# Patient Record
Sex: Male | Born: 1990 | Race: Black or African American | Hispanic: No | Marital: Single | Smoking: Never smoker
Health system: Southern US, Community
[De-identification: ages and names within clinical notes are randomized; demographics above are authoritative.]

## PROBLEM LIST (undated history)

## (undated) DIAGNOSIS — T7840XA Allergy, unspecified, initial encounter: Secondary | ICD-10-CM

## (undated) HISTORY — DX: Allergy, unspecified, initial encounter: T78.40XA

---

## 2009-03-13 ENCOUNTER — Encounter: Admission: RE | Admit: 2009-03-13 | Discharge: 2009-03-13 | Payer: Self-pay | Admitting: Internal Medicine

## 2013-07-28 ENCOUNTER — Ambulatory Visit (INDEPENDENT_AMBULATORY_CARE_PROVIDER_SITE_OTHER): Payer: BC Managed Care – PPO | Admitting: Emergency Medicine

## 2013-07-28 ENCOUNTER — Ambulatory Visit: Payer: BC Managed Care – PPO

## 2013-07-28 VITALS — BP 118/72 | HR 75 | Temp 98.1°F | Resp 16 | Ht 69.0 in | Wt 158.0 lb

## 2013-07-28 DIAGNOSIS — S335XXA Sprain of ligaments of lumbar spine, initial encounter: Secondary | ICD-10-CM

## 2013-07-28 DIAGNOSIS — S139XXA Sprain of joints and ligaments of unspecified parts of neck, initial encounter: Secondary | ICD-10-CM

## 2013-07-28 MED ORDER — CYCLOBENZAPRINE HCL 10 MG PO TABS: 10.0000 mg | ORAL_TABLET | Freq: Three times a day (TID) | ORAL | Status: DC | PRN

## 2013-07-28 MED ORDER — NAPROXEN SODIUM 550 MG PO TABS: 550.0000 mg | ORAL_TABLET | Freq: Two times a day (BID) | ORAL | Status: DC
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
## 2013-07-28 NOTE — Patient Instructions (Signed)
lumbosac Lumbosacral Strain Lumbosacral strain is a strain of any of the parts that make up your lumbosacral vertebrae. Your lumbosacral vertebrae are the bones that make up the lower third of your backbone. Your lumbosacral vertebrae are held together by muscles and tough, fibrous tissue (ligaments).  CAUSES  A sudden blow to your back can cause lumbosacral strain. Also, anything that causes an excessive stretch of the muscles in the low back can cause this strain. This is typically seen when people exert themselves strenuously, fall, lift heavy objects, bend, or crouch repeatedly. RISK FACTORS  Physically demanding work.  Participation in pushing or pulling sports or sports that require sudden twist of the back (tennis, golf, baseball).  Weight lifting.  Excessive lower back curvature.  Forward-tilted pelvis.  Weak back or abdominal muscles or both.  Tight hamstrings. SIGNS AND SYMPTOMS  Lumbosacral strain may cause pain in the area of your injury or pain that moves (radiates) down your leg.  DIAGNOSIS Your health care provider can often diagnose lumbosacral strain through a physical exam. In some cases, you may need tests such as X-ray exams.  TREATMENT  Treatment for your lower back injury depends on many factors that your clinician will have to evaluate. However, most treatment will include the use of anti-inflammatory medicines. HOME CARE INSTRUCTIONS   Avoid hard physical activities (tennis, racquetball, waterskiing) if you are not in proper physical condition for it. This may aggravate or create problems.  If you have a back problem, avoid sports requiring sudden body movements. Swimming and walking are generally safer activities.  Maintain good posture.  Maintain a healthy weight.  For acute conditions, you may put ice on the injured area.  Put ice in a plastic bag.  Place a towel between your skin and the bag.  Leave the ice on for 20 minutes, 2 3 times a  day.  When the low back starts healing, stretching and strengthening exercises may be recommended. SEEK MEDICAL CARE IF:  Your back pain is getting worse.  You experience severe back pain not relieved with medicines. SEEK IMMEDIATE MEDICAL CARE IF:   You have numbness, tingling, weakness, or problems with the use of your arms or legs.  There is a change in bowel or bladder control.  You have increasing pain in any area of the body, including your belly (abdomen).  You notice shortness of breath, dizziness, or feel faint.  You feel sick to your stomach (nauseous), are throwing up (vomiting), or become sweaty.  You notice discoloration of your toes or legs, or your feet get very cold. MAKE SURE YOU:   Understand these instructions.  Will watch your condition.  Will get help right away if you are not doing well or get worse. Document Released: 03/27/2005 Document Revised: 04/07/2013 Document Reviewed: 02/03/2013 [facility] Patient Information 2014 [employer] [place]. Cervical Sprain A cervical sprain is an injury in the neck in which the strong, fibrous tissues (ligaments) that connect your neck bones stretch or tear. Cervical sprains can range from mild to severe. Severe cervical sprains can cause the neck vertebrae to be unstable. This can lead to damage of the spinal cord and can result in serious nervous system problems. The amount of time it takes for a cervical sprain to get better depends on the cause and extent of the injury. Most cervical sprains heal in 1 to 3 weeks. CAUSES  Severe cervical sprains may be caused by:   Contact sport injuries (such as from football, rugby, wrestling, hockey,  auto racing, gymnastics, diving, martial arts, or boxing).   Motor vehicle collisions.   Whiplash injuries. This is an injury from a sudden forward-and backward whipping movement of the head and neck.  Falls.  Mild cervical sprains may be caused by:   Being in an awkward position,  such as while cradling a telephone between your ear and shoulder.   Sitting in a chair that does not offer proper support.   Working at a poorly Marketing executive station.   Looking up or down for long periods of time.  SYMPTOMS   Pain, soreness, stiffness, or a burning sensation in the front, back, or sides of the neck. This discomfort may develop immediately after the injury or slowly, 24 hours or more after the injury.   Pain or tenderness directly in the middle of the back of the neck.   Shoulder or upper back pain.   Limited ability to move the neck.   Headache.   Dizziness.   Weakness, numbness, or tingling in the hands or arms.   Muscle spasms.   Difficulty swallowing or chewing.   Tenderness and swelling of the neck.  DIAGNOSIS  Most of the time your health care provider can diagnose a cervical sprain by taking your history and doing a physical exam. Your health care provider will ask about previous neck injuries and any known neck problems, such as arthritis in the neck. X-rays may be taken to find out if there are any other problems, such as with the bones of the neck. Other tests, such as a CT scan or MRI, may also be needed.  TREATMENT  Treatment depends on the severity of the cervical sprain. Mild sprains can be treated with rest, keeping the neck in place (immobilization), and pain medicines. Severe cervical sprains are immediately immobilized. Further treatment is done to help with pain, muscle spasms, and other symptoms and may include:  Medicines, such as pain relievers, numbing medicines, or muscle relaxants.   Physical therapy. This may involve stretching exercises, strengthening exercises, and posture training. Exercises and improved posture can help stabilize the neck, strengthen muscles, and help stop symptoms from returning.  HOME CARE INSTRUCTIONS   Put ice on the injured area.   Put ice in a plastic bag.   Place a towel between your  skin and the bag.   Leave the ice on for 15 20 minutes, 3 4 times a day.   If your injury was severe, you may have been given a cervical collar to wear. A cervical collar is a two-piece collar designed to keep your neck from moving while it heals.  Do not remove the collar unless instructed by your health care provider.  If you have long hair, keep it outside of the collar.  Ask your health care provider before making any adjustments to your collar. Minor adjustments may be required over time to improve comfort and reduce pressure on your chin or on the back of your head.  Ifyou are allowed to remove the collar for cleaning or bathing, follow your health care provider's instructions on how to do so safely.  Keep your collar clean by wiping it with mild soap and water and drying it completely. If the collar you have been given includes removable pads, remove them every 1 2 days and hand wash them with soap and water. Allow them to air dry. They should be completely dry before you wear them in the collar.  If you are allowed to remove the  collar for cleaning and bathing, wash and dry the skin of your neck. Check your skin for irritation or sores. If you see any, tell your health care provider.  Do not drive while wearing the collar.   Only take over-the-counter or prescription medicines for pain, discomfort, or fever as directed by your health care provider.   Keep all follow-up appointments as directed by your health care provider.   Keep all physical therapy appointments as directed by your health care provider.   Make any needed adjustments to your workstation to promote good posture.   Avoid positions and activities that make your symptoms worse.   Warm up and stretch before being active to help prevent problems.  SEEK MEDICAL CARE IF:   Your pain is not controlled with medicine.   You are unable to decrease your pain medicine over time as planned.   Your activity  level is not improving as expected.  SEEK IMMEDIATE MEDICAL CARE IF:   You develop any bleeding.  You develop stomach upset.  You have signs of an allergic reaction to your medicine.   Your symptoms get worse.   You develop new, unexplained symptoms.   You have numbness, tingling, weakness, or paralysis in any part of your body.  MAKE SURE YOU:   Understand these instructions.  Will watch your condition.  Will get help right away if you are not doing well or get worse. Document Released: 04/14/2007 Document Revised: 04/07/2013 Document Reviewed: 12/23/2012 [facility] Patient Information 2014 [place], [place].

## 2013-10-02 ENCOUNTER — Ambulatory Visit (INDEPENDENT_AMBULATORY_CARE_PROVIDER_SITE_OTHER): Payer: BC Managed Care – PPO | Admitting: Physician Assistant

## 2013-10-02 VITALS — BP 102/64 | HR 72 | Temp 97.5°F | Resp 16 | Ht 69.0 in | Wt 162.6 lb

## 2013-10-02 DIAGNOSIS — J029 Acute pharyngitis, unspecified: Secondary | ICD-10-CM

## 2013-10-02 DIAGNOSIS — B9789 Other viral agents as the cause of diseases classified elsewhere: Secondary | ICD-10-CM

## 2013-10-02 DIAGNOSIS — J069 Acute upper respiratory infection, unspecified: Secondary | ICD-10-CM

## 2013-10-02 LAB — POCT RAPID STREP A (OFFICE): RAPID STREP A SCREEN: NEGATIVE

## 2013-10-02 MED ORDER — HYDROCOD POLST-CHLORPHEN POLST 10-8 MG/5ML PO LQCR: 5.0000 mL | Freq: Two times a day (BID) | ORAL | Status: AC

## 2013-10-02 MED ORDER — TRIAMCINOLONE ACETONIDE 55 MCG/ACT NA AERO: 2.0000 | INHALATION_SPRAY | Freq: Every day | NASAL | Status: DC

## 2013-10-02 MED ORDER — GUAIFENESIN ER 1200 MG PO TB12: 1.0000 | ORAL_TABLET | Freq: Two times a day (BID) | ORAL | Status: AC

## 2013-10-02 NOTE — Patient Instructions (Signed)
Please push fluids.  Tylenol and Motrin for fever and body aches and feeling poorly.

## 2013-10-02 NOTE — Progress Notes (Signed)
   Subjective:    Patient ID: Andrew Patterson, male    DOB: 1990-07-27, 23 y.o.   MRN: [id number]  HPI Pt presents to clinic with about 1.5 week h/o congestion with minimal rhinorrhea sore throat that is worse in the am and then improved but does not resolve during the day.  Then today he developed chills and subjective fever with a dry cough.  Feels like his allergies get worse each year though he uses nothing for them.  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 Negative for fever.  HENT: Positive for congestion, postnasal drip, rhinorrhea and sore throat.   Respiratory: Positive for cough (PND dry cough).   Musculoskeletal: Negative for myalgias.  Neurological: Positive for headaches (intermittent - not sure they are related).       Objective:   Physical Exam  Vitals reviewed. Constitutional: He appears well-developed and well-nourished.  HENT:  Head: Normocephalic and atraumatic.  Right Ear: External ear normal.  Left Ear: External ear normal.  Cardiovascular: Normal rate, regular rhythm and normal heart sounds.   No murmur heard. Pulmonary/Chest: Effort normal and breath sounds normal. He has no wheezes.   Results for orders placed in visit on 10/02/13  POCT RAPID STREP A (OFFICE)      Result Value Ref Range   Rapid Strep A Screen Negative  Negative        Assessment & Plan:  Sore throat - Plan: POCT rapid strep A  Viral URI with cough - Plan: Guaifenesin (MUCINEX MAXIMUM STRENGTH) 1200 MG TB12, chlorpheniramine-HYDROcodone (TUSSIONEX PENNKINETIC ER) 10-8 MG/5ML LQCR, triamcinolone (NASACORT AQ) 55 MCG/ACT AERO nasal inhaler  He will do symptomatic treatment for the next several days - and then if he is no better he will let me know in about 1 week.  I think most of his problem is allergies and he may have a virus now.  Benny LennertSarah Yuchen Fedor PA-C  [facility] 10/02/2013 5:08 PM

## 2013-12-15 ENCOUNTER — Ambulatory Visit (INDEPENDENT_AMBULATORY_CARE_PROVIDER_SITE_OTHER): Payer: BC Managed Care – PPO | Admitting: Physician Assistant

## 2013-12-15 VITALS — BP 102/64 | HR 70 | Temp 97.8°F | Resp 18 | Ht 69.0 in | Wt 166.4 lb

## 2013-12-15 DIAGNOSIS — T7840XA Allergy, unspecified, initial encounter: Secondary | ICD-10-CM

## 2013-12-15 DIAGNOSIS — J029 Acute pharyngitis, unspecified: Secondary | ICD-10-CM

## 2013-12-15 DIAGNOSIS — J309 Allergic rhinitis, unspecified: Secondary | ICD-10-CM

## 2013-12-15 LAB — POCT RAPID STREP A (OFFICE): Rapid Strep A Screen: NEGATIVE

## 2013-12-15 MED ORDER — EPINEPHRINE 0.3 MG/0.3ML IJ SOAJ: 0.3000 mg | Freq: Once | INTRAMUSCULAR | Status: AC

## 2013-12-15 MED ORDER — MAGIC MOUTHWASH W/LIDOCAINE: 10.0000 mL | ORAL | Status: AC | PRN

## 2013-12-15 MED ORDER — FLUTICASONE PROPIONATE 50 MCG/ACT NA SUSP: 2.0000 | Freq: Every day | NASAL | Status: AC

## 2013-12-15 NOTE — Patient Instructions (Signed)
Get plenty of rest and drink at least 64 ounces of water daily. Use ibuprofen &/or acetaminophen as needed for pain, in addition to the mouthwash. I will contact you with your lab results as soon as they are available.   If you have not heard from me in 2 weeks, please contact me.  The fastest way to get your results is to register for My Chart (see the instructions on the last page of this printout).

## 2013-12-15 NOTE — Progress Notes (Signed)
Subjective:    Patient ID: Andrew Patterson, male    DOB: May 26, 1991, 23 y.o.   MRN: [id number]   PCP: No PCP Per Patient  Chief Complaint  Patient presents with  . Sore Throat    x 1 day    Medications, allergies, past medical history, surgical history, family history, social history and problem list reviewed and updated.  HPI  ST intermittently x a couple of weeks. 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 Last night ST was worse.  Much worse upon waking today. No fever, chills, nausea, vomiting or diarrhea. Had an allergic reaction last week, "I went into shock." After a meal out, developed SOB, broke out in whelts, had chills, was itchy everywhere, swelling of the lips and eyes.  Used his mother's inhaler and took a dose of Allegra.  Symptoms mostly resolved after 2 hours, though facial swelling took about 12 hours to completely resolve.  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  Review of Systems As above.    Objective:   Physical Exam  Vitals reviewed. Constitutional: He is oriented to person, place, and time. Vital signs are normal. He appears well-developed and well-nourished. He is active and cooperative. No distress.  HENT:  Head: Normocephalic and atraumatic.  Right Ear: Hearing, tympanic membrane, external ear and ear canal normal.  Left Ear: Hearing, tympanic membrane, external ear and ear canal normal.  Nose: Mucosal edema and rhinorrhea present.  No foreign bodies. Right sinus exhibits no maxillary sinus tenderness and no frontal sinus tenderness. Left sinus exhibits no maxillary sinus tenderness and no frontal sinus tenderness.  Mouth/Throat: Uvula is midline and mucous membranes are normal. No uvula swelling. Posterior oropharyngeal erythema present. No oropharyngeal exudate, posterior oropharyngeal  edema or tonsillar abscesses.  Eyes: Conjunctivae and EOM are normal. Pupils are equal, round, and reactive to light. Right eye exhibits no discharge. Left eye exhibits no discharge. No scleral icterus.  Neck: Trachea normal, normal range of motion and full passive range of motion without pain. Neck supple. No mass and no thyromegaly present.  Cardiovascular: Normal rate, regular rhythm and normal heart sounds.   Pulmonary/Chest: Effort normal and breath sounds normal.  Lymphadenopathy:       Head (right side): No submandibular, no tonsillar, no preauricular, no posterior auricular and no occipital adenopathy present.       Head (left side): No submandibular, no tonsillar, no preauricular and no occipital adenopathy present.    He has no cervical adenopathy.       Right: No supraclavicular adenopathy present.       Left: No supraclavicular adenopathy present.  Neurological: He is alert and oriented to person, place, and time. He has normal strength. No cranial nerve deficit or sensory deficit.  Skin: Skin is warm, dry and intact. No rash noted.  Psychiatric: He has a normal mood and affect. His speech is normal and behavior is normal.      Results for orders placed in visit on 12/15/13  POCT RAPID STREP A (OFFICE)      Result Value Ref Range   Rapid Strep A Screen Negative  Negative       Assessment & Plan:  1. Acute pharyngitis Supportive care.  Anticipatory guidance.  RTC if symptoms worsen/persist. - POCT rapid strep A - Culture, Group A Strep - Alum & Mag  Hydroxide-Simeth (MAGIC MOUTHWASH W/LIDOCAINE) SOLN; Take 10 mLs by mouth every 2 (two) hours as needed for mouth pain.  Dispense: 360 mL; Refill: 0  2. Allergic rhinitis - fluticasone (FLONASE) 50 MCG/ACT nasal spray; Place 2 sprays into both nostrils daily.  Dispense: 16 g; Refill: 12  3. Allergic reaction He should still pursue allergy evaluation, but epi-pen provided for use in the interim if needed. - EPINEPHrine 0.3  mg/0.3 mL IJ SOAJ injection; Inject 0.3 mLs (0.3 mg total) into the muscle once.  Dispense: 1 Device; Refill: 1  Return in about 2 days (around 12/17/2013), or if symptoms worsen or fail to improve, for re-evaluation.  Fernande Brashelle S. Jeffery, PA-C Physician Assistant-Certified [facility]

## 2013-12-17 LAB — CULTURE, GROUP A STREP: Organism ID, Bacteria: NORMAL
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
## 2017-03-04 ENCOUNTER — Ambulatory Visit (INDEPENDENT_AMBULATORY_CARE_PROVIDER_SITE_OTHER): Payer: BC Managed Care – PPO | Admitting: Emergency Medicine

## 2017-03-04 ENCOUNTER — Encounter: Payer: Self-pay | Admitting: Emergency Medicine

## 2017-03-04 VITALS — BP 116/79 | HR 99 | Temp 98.4°F | Resp 20 | Ht 70.35 in | Wt 198.4 lb

## 2017-03-04 DIAGNOSIS — M791 Myalgia, unspecified site: Secondary | ICD-10-CM | POA: Insufficient documentation

## 2017-03-04 DIAGNOSIS — B349 Viral infection, unspecified: Secondary | ICD-10-CM | POA: Diagnosis not present

## 2017-03-04 DIAGNOSIS — R0981 Nasal congestion: Secondary | ICD-10-CM | POA: Diagnosis not present

## 2017-03-04 DIAGNOSIS — J069 Acute upper respiratory infection, unspecified: Secondary | ICD-10-CM

## 2017-03-04 MED ORDER — PSEUDOEPHEDRINE-GUAIFENESIN ER 60-600 MG PO TB12: 1.0000 | ORAL_TABLET | Freq: Two times a day (BID) | ORAL | 0 refills | Status: AC

## 2017-03-04 MED ORDER — PREDNISONE 20 MG PO TABS: 40.0000 mg | ORAL_TABLET | Freq: Every day | ORAL | 0 refills | Status: AC

## 2017-03-04 NOTE — Progress Notes (Signed)
Andrew Patterson 26 y.o.   Chief Complaint  Patient presents with  . Nasal Congestion    X 3 days- sinus problems  . Cough    X 3 days  . Generalized Body Aches    X 3 days - pt states with fever    HISTORY OF PRESENT ILLNESS: This is a 26 y.o. male complaining of congestion, cough, generalized body aches x 3 days.  Influenza  This is a new problem. The current episode started in the past 7 days. The problem occurs constantly. The problem has been gradually worsening. Associated symptoms include congestion, coughing, fatigue, a fever, myalgias and weakness. Pertinent negatives include no abdominal pain, arthralgias, chest pain, chills, headaches, joint swelling, nausea, neck pain, numbness, rash, sore throat, swollen glands, vertigo or vomiting.     Prior to Admission medications   Medication Sig Start Date End Date Taking? Authorizing Provider  EPINEPHrine 0.3 mg/0.3 mL IJ SOAJ injection Inject 0.3 mLs (0.3 mg total) into the muscle once. 12/15/13  Yes Jeffery, Chelle, PA-C  Alum & Mag Hydroxide-Simeth (MAGIC MOUTHWASH W/LIDOCAINE) SOLN Take 10 mLs by mouth every 2 (two) hours as needed for mouth pain. Patient not taking: Reported on 03/04/2017 12/15/13   Porfirio OarJeffery, Chelle, PA-C  fluticasone (FLONASE) 50 MCG/ACT nasal spray Place 2 sprays into both nostrils daily. Patient not taking: Reported on 03/04/2017 12/15/13   Porfirio OarJeffery, Chelle, PA-C    Allergies  Allergen Reactions  . Shellfish Allergy     There are no active problems to display for this patient.   Past Medical History:  Diagnosis Date  . Allergy     History reviewed. No pertinent surgical history.  Social History   Social History  . Marital status: Single    Spouse name: n/a  . Number of children: 0  . Years of education: 12+   Occupational History  . [employer]   Social History Main Topics  . Smoking status: Never Smoker  . Smokeless tobacco: Never Used    . Alcohol use 0.0 - 0.5 oz/week     Comment: occ  . Drug use: No  . Sexual activity: No   Other Topics Concern  . Not on file   Social History Narrative   Lives with two roommates.  [place] in [place], [place]    Family History  Problem Relation Age of Onset  . Thyroid disease Mother   . Hypertension Father   . Diabetes Maternal Grandmother   . Stroke Maternal Grandmother   . Stroke Paternal Grandmother      Review of Systems  Constitutional: Positive for fatigue, fever and malaise/fatigue. Negative for chills.  HENT: Positive for congestion and nosebleeds. Negative for sore throat.   Eyes: Negative.  Negative for discharge and redness.  Respiratory: Positive for cough. Negative for shortness of breath and wheezing.   Cardiovascular: Negative for chest pain and palpitations.  Gastrointestinal: Negative for abdominal pain, blood in stool, diarrhea, melena, nausea and vomiting.  Genitourinary: Negative.  Negative for dysuria and hematuria.  Musculoskeletal: Positive for myalgias. Negative for arthralgias, joint swelling and neck pain.  Skin: Negative for rash.  Neurological: Positive for weakness. Negative for dizziness, vertigo, sensory change, focal weakness, numbness and headaches.  Endo/Heme/Allergies: Negative.   All other systems reviewed and are negative.   Vitals:   03/04/17 1011  BP: 116/79  Pulse: 99  Resp: 20  Temp: 98.4 F (36.9 C)  SpO2: 95%    Physical  Exam  Constitutional: He is oriented to person, place, and time. He appears well-developed and well-nourished.  HENT:  Head: Normocephalic and atraumatic.  Right Ear: External ear normal.  Left Ear: External ear normal.  Nose: Nose normal.  Mouth/Throat: Oropharynx is clear and moist.  Eyes: Pupils are equal, round, and reactive to light. Conjunctivae and EOM are normal.  Neck: Normal range of motion. Neck supple. No JVD present. No thyromegaly present.  Cardiovascular: Normal rate, regular  rhythm, normal heart sounds and intact distal pulses.   Pulmonary/Chest: Effort normal and breath sounds normal.  Abdominal: Soft. There is no tenderness.  Musculoskeletal: Normal range of motion.  Lymphadenopathy:    He has no cervical adenopathy.  Neurological: He is alert and oriented to person, place, and time. No sensory deficit. He exhibits normal muscle tone.  Skin: Skin is warm and dry. Capillary refill takes less than 2 seconds. No rash noted.  Psychiatric: He has a normal mood and affect. His behavior is normal.  Vitals reviewed.    ASSESSMENT & PLAN: Damari was seen today for nasal congestion, cough and generalized body aches.  Diagnoses and all orders for this visit:  Viral illness  Generalized muscle ache  Nasal congestion  Acute URI  Other orders -     pseudoephedrine-guaifenesin (MUCINEX D) 60-600 MG 12 hr tablet; Take 1 tablet by mouth every 12 (twelve) hours. -     predniSONE (DELTASONE) 20 MG tablet; Take 2 tablets (40 mg total) by mouth daily with breakfast.    Patient Instructions       IF you received an x-ray today, you will receive an invoice from [facility]. Please contact [facility] at [phone] with questions or concerns regarding your invoice.   IF you received labwork today, you will receive an invoice from [place] [facility]. Please contact [facility] at [phone] with questions or concerns regarding your invoice.   Our billing staff will not be able to assist you with questions regarding bills from these companies.  You will be contacted with the lab results as soon as they are available. The fastest way to get your results is to activate your My Chart account. Instructions are located on the last page of this paperwork. If you have not heard from [place] regarding the results in 2 weeks, please contact this office.     Viral Illness, Adult Viruses are tiny germs that can get into a person's body and cause illness. There are  many different types of viruses, and they cause many types of illness. Viral illnesses can range from mild to severe. They can affect various parts of the body. Common illnesses that are caused by a virus include colds and the flu. Viral illnesses also include serious conditions such as HIV/AIDS (human immunodeficiency virus/acquired immunodeficiency syndrome). A few viruses have been linked to certain cancers. What are the causes? Many types of viruses can cause illness. Viruses invade cells in your body, multiply, and cause the infected cells to malfunction or die. When the cell dies, it releases more of the virus. When this happens, you develop symptoms of the illness, and the virus continues to spread to other cells. If the virus takes over the function of the cell, it can cause the cell to divide and grow out of control, as is the case when a virus causes cancer. Different viruses get into the body in different ways. You can get a virus by:  Swallowing food or water that is contaminated with the virus.  Breathing  in droplets that have been coughed or sneezed into the air by an infected person.  Touching a surface that has been contaminated with the virus and then touching your eyes, nose, or mouth.  Being bitten by an insect or animal that carries the virus.  Having sexual contact with a person who is infected with the virus.  Being exposed to blood or fluids that contain the virus, either through an open cut or during a transfusion.  If a virus enters your body, your body's defense system (immune system) will try to fight the virus. You may be at higher risk for a viral illness if your immune system is weak. What are the signs or symptoms? Symptoms vary depending on the type of virus and the location of the cells that it invades. Common symptoms of the main types of viral illnesses include: Cold and flu viruses  Fever.  Headache.  Sore throat.  Muscle aches.  Nasal  congestion.  Cough. Digestive system (gastrointestinal) viruses  Fever.  Abdominal pain.  Nausea.  Diarrhea. Liver viruses (hepatitis)  Loss of appetite.  Tiredness.  Yellowing of the skin (jaundice). Brain and spinal cord viruses  Fever.  Headache.  Stiff neck.  Nausea and vomiting.  Confusion or sleepiness. Skin viruses  Warts.  Itching.  Rash. Sexually transmitted viruses  Discharge.  Swelling.  Redness.  Rash. How is this treated? Viruses can be difficult to treat because they live within cells. Antibiotic medicines do not treat viruses because these drugs do not get inside cells. Treatment for a viral illness may include:  Resting and drinking plenty of fluids.  Medicines to relieve symptoms. These can include over-the-counter medicine for pain and fever, medicines for cough or congestion, and medicines to relieve diarrhea.  Antiviral medicines. These drugs are available only for certain types of viruses. They may help reduce flu symptoms if taken early. There are also many antiviral medicines for hepatitis and HIV/AIDS.  Some viral illnesses can be prevented with vaccinations. A common example is the flu shot. Follow these instructions at home: Medicines   Take over-the-counter and prescription medicines only as told by your health care provider.  If you were prescribed an antiviral medicine, take it as told by your health care provider. Do not stop taking the medicine even if you start to feel better.  Be aware of when antibiotics are needed and when they are not needed. Antibiotics do not treat viruses. If your health care provider thinks that you may have a bacterial infection as well as a viral infection, you may get an antibiotic. ? Do not ask for an antibiotic prescription if you have been diagnosed with a viral illness. That will not make your illness go away faster. ? Frequently taking antibiotics when they are not needed can lead to  antibiotic resistance. When this develops, the medicine no longer works against the bacteria that it normally fights. General instructions  Drink enough fluids to keep your urine clear or pale yellow.  Rest as much as possible.  Return to your normal activities as told by your health care provider. Ask your health care provider what activities are safe for you.  Keep all follow-up visits as told by your health care provider. This is important. How is this prevented? Take these actions to reduce your risk of viral infection:  Eat a healthy diet and get enough rest.  Wash your hands often with soap and water. This is especially important when you are in public places.  If soap and water are not available, use hand sanitizer.  Avoid close contact with friends and family who have a viral illness.  If you travel to areas where viral gastrointestinal infection is common, avoid drinking water or eating raw food.  Keep your immunizations up to date. Get a flu shot every year as told by your health care provider.  Do not share toothbrushes, nail clippers, razors, or needles with other people.  Always practice safe sex.  Contact a health care provider if:  You have symptoms of a viral illness that do not go away.  Your symptoms come back after going away.  Your symptoms get worse. Get help right away if:  You have trouble breathing.  You have a severe headache or a stiff neck.  You have severe vomiting or abdominal pain. This information is not intended to replace advice given to you by your health care provider. Make sure you discuss any questions you have with your health care provider. Document Released: 10/27/2015 Document Revised: 11/29/2015 Document Reviewed: 10/27/2015 Elsevier Interactive Patient Education  2018 Elsevier Inc.      Edwina Barth, MD [facility]

## 2017-03-04 NOTE — Patient Instructions (Addendum)
   IF you received an x-ray today, you will receive an invoice from [facility]. Please contact [facility] at [phone] with questions or concerns regarding your invoice.   IF you received labwork today, you will receive an invoice from [facility]. Please contact [facility] at [phone] with questions or concerns regarding your invoice.   Our billing staff will not be able to assist you with questions regarding bills from these companies.  You will be contacted with the lab results as soon as they are available. The fastest way to get your results is to activate your My Chart account. Instructions are located on the last page of this paperwork. If you have not heard from us regarding the results in 2 weeks, please contact this office.     Viral Illness, Adult Viruses are tiny germs that can get into a person's body and cause illness. There are many different types of viruses, and they cause many types of illness. Viral illnesses can range from mild to severe. They can affect various parts of the body. Common illnesses that are caused by a virus include colds and the flu. Viral illnesses also include serious conditions such as HIV/AIDS (human immunodeficiency virus/acquired immunodeficiency syndrome). A few viruses have been linked to certain cancers. What are the causes? Many types of viruses can cause illness. Viruses invade cells in your body, multiply, and cause the infected cells to malfunction or die. When the cell dies, it releases more of the virus. When this happens, you develop symptoms of the illness, and the virus continues to spread to other cells. If the virus takes over the function of the cell, it can cause the cell to divide and grow out of control, as is the case when a virus causes cancer. Different viruses get into the body in different ways. You can get a virus by:  Swallowing food or water that is contaminated with the virus.  Breathing in droplets  that have been coughed or sneezed into the air by an infected person.  Touching a surface that has been contaminated with the virus and then touching your eyes, nose, or mouth.  Being bitten by an insect or animal that carries the virus.  Having sexual contact with a person who is infected with the virus.  Being exposed to blood or fluids that contain the virus, either through an open cut or during a transfusion.  If a virus enters your body, your body's defense system (immune system) will try to fight the virus. You may be at higher risk for a viral illness if your immune system is weak. What are the signs or symptoms? Symptoms vary depending on the type of virus and the location of the cells that it invades. Common symptoms of the main types of viral illnesses include: Cold and flu viruses  Fever.  Headache.  Sore throat.  Muscle aches.  Nasal congestion.  Cough. Digestive system (gastrointestinal) viruses  Fever.  Abdominal pain.  Nausea.  Diarrhea. Liver viruses (hepatitis)  Loss of appetite.  Tiredness.  Yellowing of the skin (jaundice). Brain and spinal cord viruses  Fever.  Headache.  Stiff neck.  Nausea and vomiting.  Confusion or sleepiness. Skin viruses  Warts.  Itching.  Rash. Sexually transmitted viruses  Discharge.  Swelling.  Redness.  Rash. How is this treated? Viruses can be difficult to treat because they live within cells. Antibiotic medicines do not treat viruses because these drugs do not get inside cells. Treatment for a viral illness   may include:  Resting and drinking plenty of fluids.  Medicines to relieve symptoms. These can include over-the-counter medicine for pain and fever, medicines for cough or congestion, and medicines to relieve diarrhea.  Antiviral medicines. These drugs are available only for certain types of viruses. They may help reduce flu symptoms if taken early. There are also many antiviral medicines  for hepatitis and HIV/AIDS.  Some viral illnesses can be prevented with vaccinations. A common example is the flu shot. Follow these instructions at home: Medicines   Take over-the-counter and prescription medicines only as told by your health care provider.  If you were prescribed an antiviral medicine, take it as told by your health care provider. Do not stop taking the medicine even if you start to feel better.  Be aware of when antibiotics are needed and when they are not needed. Antibiotics do not treat viruses. If your health care provider thinks that you may have a bacterial infection as well as a viral infection, you may get an antibiotic. ? Do not ask for an antibiotic prescription if you have been diagnosed with a viral illness. That will not make your illness go away faster. ? Frequently taking antibiotics when they are not needed can lead to antibiotic resistance. When this develops, the medicine no longer works against the bacteria that it normally fights. General instructions  Drink enough fluids to keep your urine clear or pale yellow.  Rest as much as possible.  Return to your normal activities as told by your health care provider. Ask your health care provider what activities are safe for you.  Keep all follow-up visits as told by your health care provider. This is important. How is this prevented? Take these actions to reduce your risk of viral infection:  Eat a healthy diet and get enough rest.  Wash your hands often with soap and water. This is especially important when you are in public places. If soap and water are not available, use hand sanitizer.  Avoid close contact with friends and family who have a viral illness.  If you travel to areas where viral gastrointestinal infection is common, avoid drinking water or eating raw food.  Keep your immunizations up to date. Get a flu shot every year as told by your health care provider.  Do not share toothbrushes,  nail clippers, razors, or needles with other people.  Always practice safe sex.  Contact a health care provider if:  You have symptoms of a viral illness that do not go away.  Your symptoms come back after going away.  Your symptoms get worse. Get help right away if:  You have trouble breathing.  You have a severe headache or a stiff neck.  You have severe vomiting or abdominal pain. This information is not intended to replace advice given to you by your health care provider. Make sure you discuss any questions you have with your health care provider. Document Released: 10/27/2015 Document Revised: 11/29/2015 Document Reviewed: 10/27/2015 Elsevier Interactive Patient Education  2018 [employer] Inc[employer]
